# Patient Record
Sex: Female | Born: 1975 | Race: White | Hispanic: No | Marital: Single
Health system: Southern US, Community
[De-identification: ages and names within clinical notes are randomized; demographics above are authoritative.]

---

## 1997-07-08 ENCOUNTER — Inpatient Hospital Stay (HOSPITAL_COMMUNITY): Admission: AD | Admit: 1997-07-08 | Discharge: 1997-07-08 | Payer: Self-pay | Admitting: *Deleted

## 1997-07-22 ENCOUNTER — Encounter (HOSPITAL_COMMUNITY): Admission: RE | Admit: 1997-07-22 | Discharge: 1997-07-28 | Payer: Self-pay | Admitting: *Deleted

## 1997-07-26 ENCOUNTER — Inpatient Hospital Stay (HOSPITAL_COMMUNITY): Admission: AD | Admit: 1997-07-26 | Discharge: 1997-07-29 | Payer: Self-pay | Admitting: *Deleted

## 1999-09-06 ENCOUNTER — Emergency Department (HOSPITAL_COMMUNITY): Admission: EM | Admit: 1999-09-06 | Discharge: 1999-09-06 | Payer: Self-pay | Admitting: Emergency Medicine

## 1999-12-31 ENCOUNTER — Inpatient Hospital Stay (HOSPITAL_COMMUNITY): Admission: AD | Admit: 1999-12-31 | Discharge: 1999-12-31 | Payer: Self-pay | Admitting: *Deleted

## 2000-01-14 ENCOUNTER — Encounter: Payer: Self-pay | Admitting: *Deleted

## 2000-01-14 ENCOUNTER — Ambulatory Visit (HOSPITAL_COMMUNITY): Admission: RE | Admit: 2000-01-14 | Discharge: 2000-01-14 | Payer: Self-pay | Admitting: Obstetrics

## 2000-01-23 ENCOUNTER — Inpatient Hospital Stay (HOSPITAL_COMMUNITY): Admission: AD | Admit: 2000-01-23 | Discharge: 2000-01-23 | Payer: Self-pay | Admitting: *Deleted

## 2000-02-01 ENCOUNTER — Inpatient Hospital Stay (HOSPITAL_COMMUNITY): Admission: AD | Admit: 2000-02-01 | Discharge: 2000-02-04 | Payer: Self-pay | Admitting: *Deleted

## 2000-03-27 ENCOUNTER — Ambulatory Visit (HOSPITAL_COMMUNITY): Admission: RE | Admit: 2000-03-27 | Discharge: 2000-03-27 | Payer: Self-pay | Admitting: *Deleted

## 2001-12-08 ENCOUNTER — Ambulatory Visit (HOSPITAL_BASED_OUTPATIENT_CLINIC_OR_DEPARTMENT_OTHER): Admission: RE | Admit: 2001-12-08 | Discharge: 2001-12-08 | Payer: Self-pay | Admitting: Oral Surgery

## 2006-06-17 ENCOUNTER — Ambulatory Visit (HOSPITAL_COMMUNITY): Admission: RE | Admit: 2006-06-17 | Discharge: 2006-06-17 | Payer: Self-pay | Admitting: Cardiology

## 2006-07-08 ENCOUNTER — Encounter: Admission: RE | Admit: 2006-07-08 | Discharge: 2006-07-08 | Payer: Self-pay | Admitting: Cardiology

## 2008-08-22 IMAGING — CR DG CHEST 2V
2 series · 2 of 2 positions shown · non-contrast
Comparison: None

CLINICAL DATA: Syncope, palpitations

CHEST - 2 VIEW:

[w chest pa]
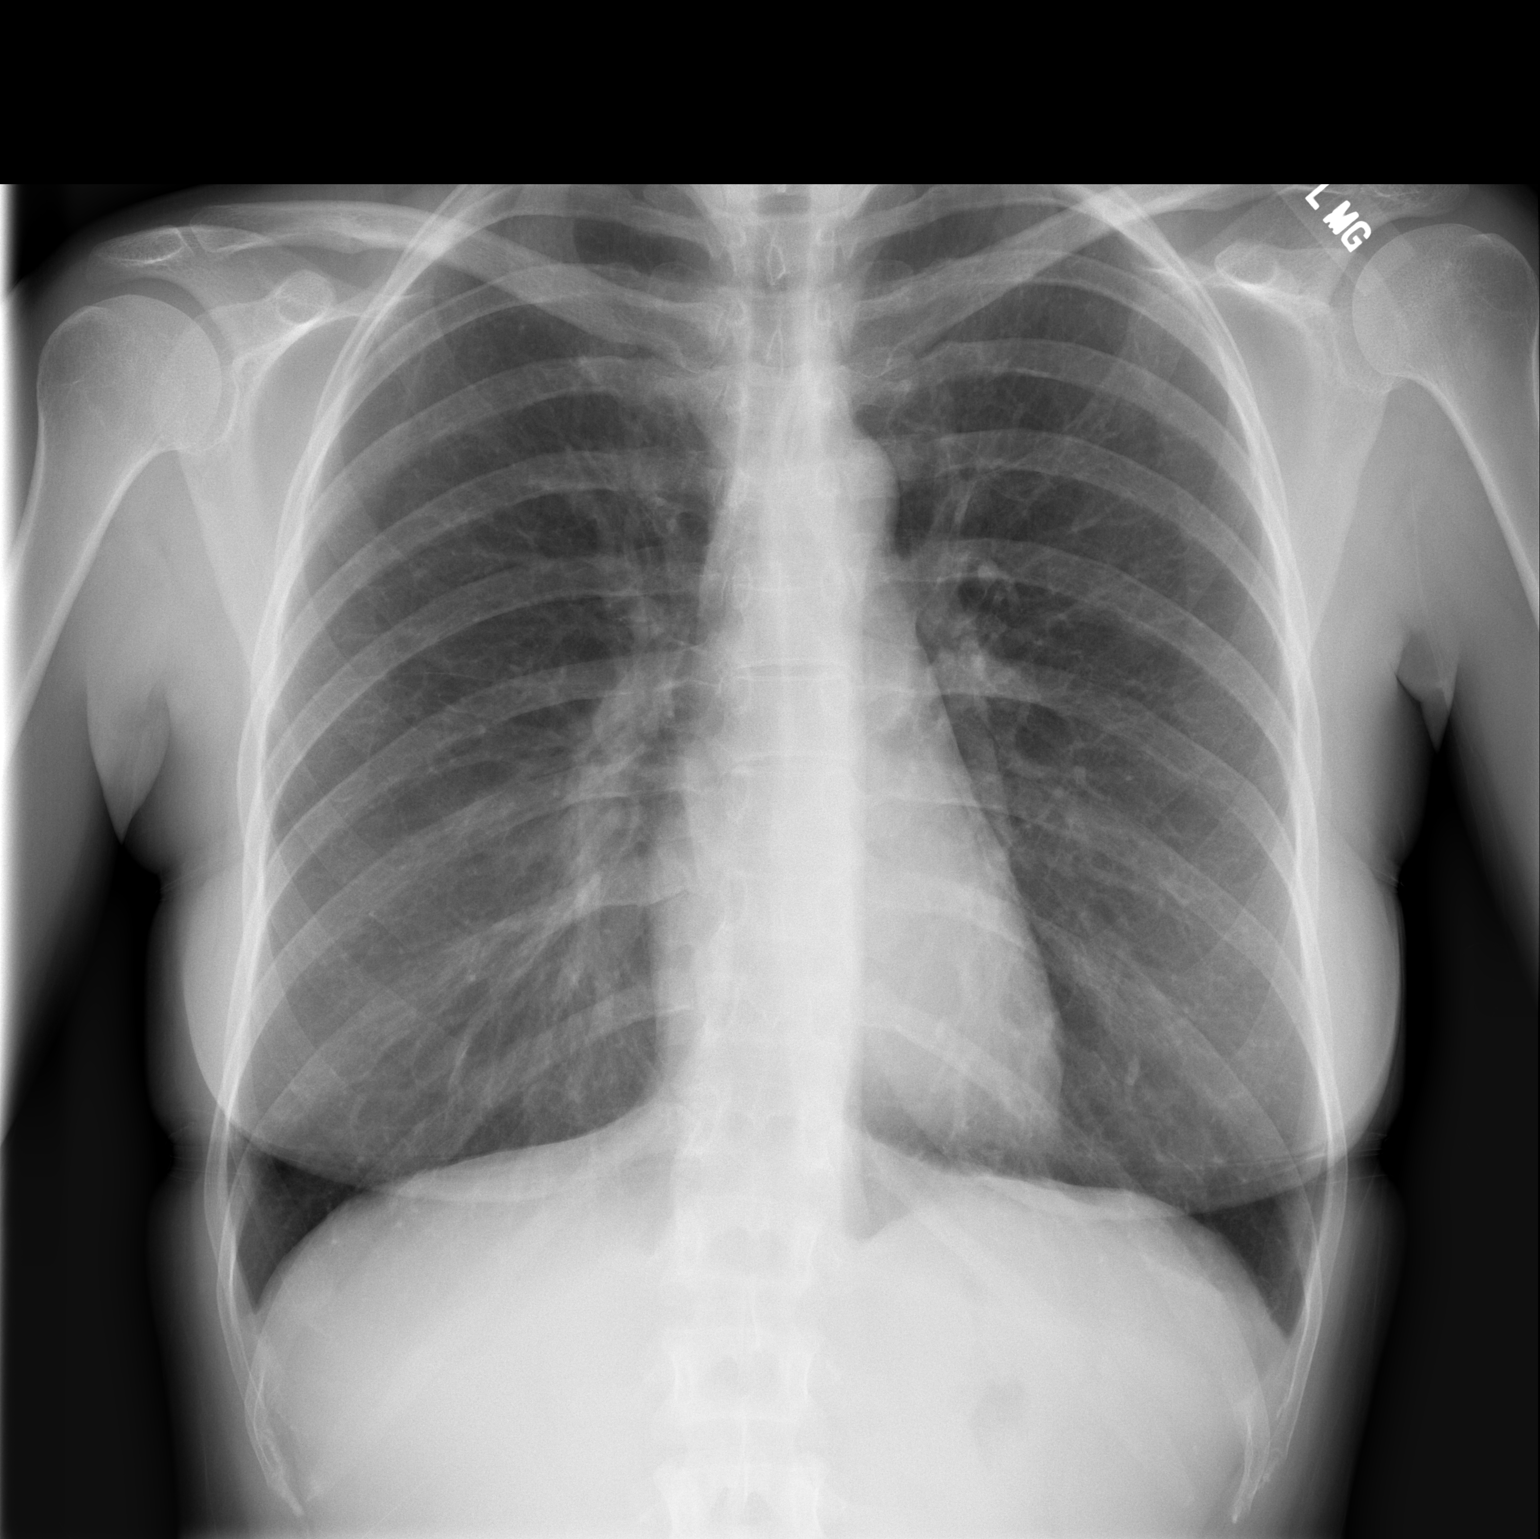

[w chest lat]
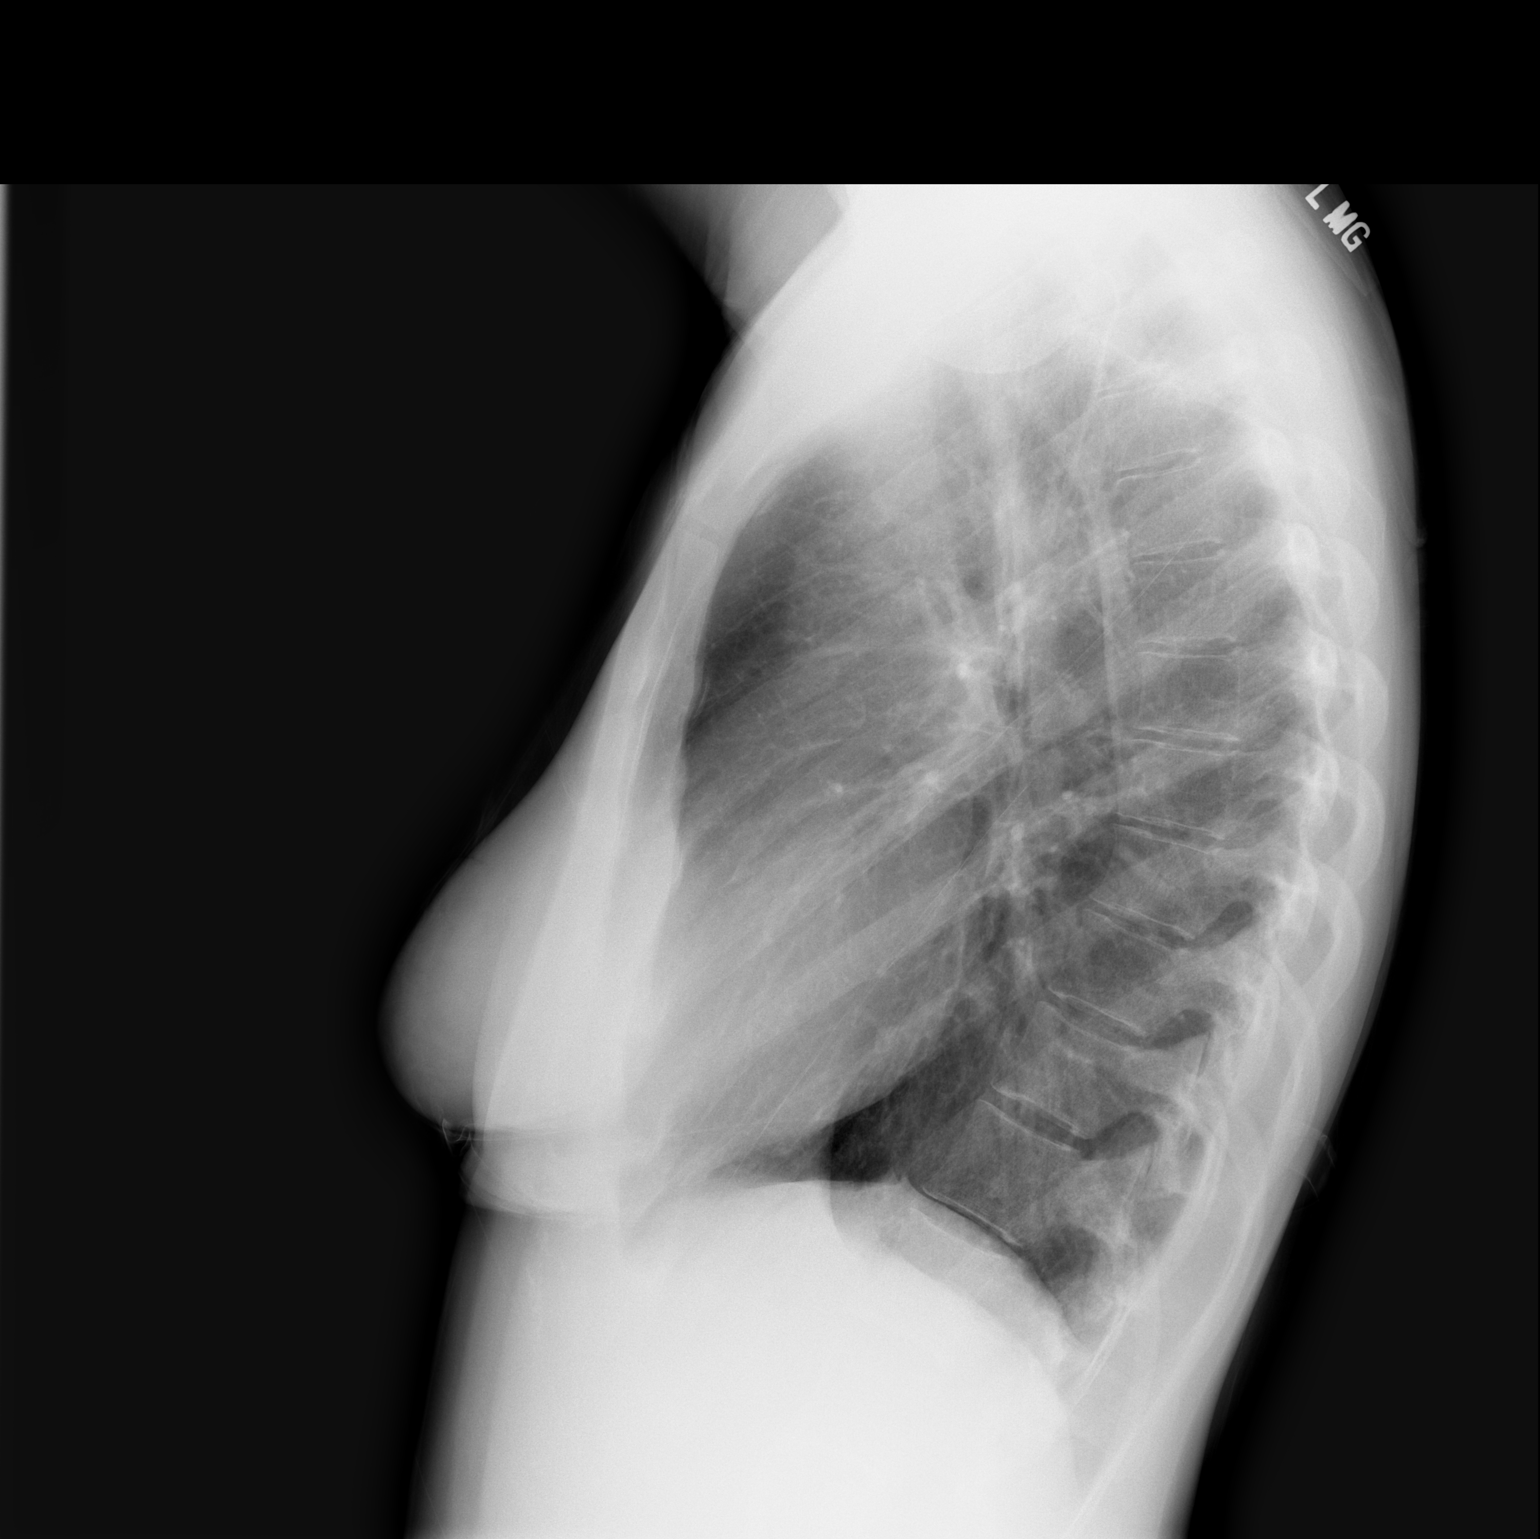

[2 of 2 positions shown; findings below may reference images not displayed]

FINDINGS: The heart size and mediastinal contours are within normal limits. 
Both lungs are clear.  The visualized skeletal structures are unremarkable.
IMPRESSION: No active cardiopulmonary disease

## 2019-09-10 ENCOUNTER — Emergency Department (HOSPITAL_COMMUNITY)
Admission: EM | Admit: 2019-09-10 | Discharge: 2019-09-10 | Disposition: A | Discharge status: Home / Self Care | Payer: Self-pay | Attending: Emergency Medicine | Admitting: Emergency Medicine

## 2019-09-10 ENCOUNTER — Encounter (HOSPITAL_COMMUNITY): Payer: Self-pay | Admitting: Emergency Medicine

## 2019-09-10 DIAGNOSIS — Z5321 Procedure and treatment not carried out due to patient leaving prior to being seen by health care provider: Secondary | ICD-10-CM | POA: Insufficient documentation

## 2019-09-10 DIAGNOSIS — M549 Dorsalgia, unspecified: Secondary | ICD-10-CM | POA: Insufficient documentation

## 2019-09-10 NOTE — ED Notes (Signed)
Called multiply times for revitalizing and unable to located in waiting room.
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
